# Patient Record
Sex: Male | Born: 1980 | Race: Black or African American | Hispanic: No | Marital: Single | Smoking: Current every day smoker
Health system: Southern US, Community
[De-identification: ages and names within clinical notes are randomized; demographics above are authoritative.]

## PROBLEM LIST (undated history)

## (undated) DIAGNOSIS — I639 Cerebral infarction, unspecified: Secondary | ICD-10-CM

## (undated) DIAGNOSIS — B0221 Postherpetic geniculate ganglionitis: Secondary | ICD-10-CM

## (undated) DIAGNOSIS — I10 Essential (primary) hypertension: Secondary | ICD-10-CM

## (undated) HISTORY — PX: FRACTURE SURGERY: SHX138

---

## 2018-06-16 ENCOUNTER — Emergency Department (HOSPITAL_COMMUNITY)
Admission: EM | Admit: 2018-06-16 | Discharge: 2018-06-16 | Disposition: A | Discharge status: Home / Self Care | Payer: Self-pay | Attending: Emergency Medicine | Admitting: Emergency Medicine

## 2018-06-16 ENCOUNTER — Encounter (HOSPITAL_COMMUNITY): Payer: Self-pay | Admitting: Emergency Medicine

## 2018-06-16 ENCOUNTER — Other Ambulatory Visit: Payer: Self-pay

## 2018-06-16 DIAGNOSIS — J020 Streptococcal pharyngitis: Secondary | ICD-10-CM | POA: Insufficient documentation

## 2018-06-16 DIAGNOSIS — F1210 Cannabis abuse, uncomplicated: Secondary | ICD-10-CM | POA: Insufficient documentation

## 2018-06-16 DIAGNOSIS — F17290 Nicotine dependence, other tobacco product, uncomplicated: Secondary | ICD-10-CM | POA: Insufficient documentation

## 2018-06-16 DIAGNOSIS — I10 Essential (primary) hypertension: Secondary | ICD-10-CM | POA: Insufficient documentation

## 2018-06-16 HISTORY — DX: Essential (primary) hypertension: I10

## 2018-06-16 LAB — GROUP A STREP BY PCR: Group A Strep by PCR: DETECTED — AB

## 2018-06-16 MED ORDER — DEXAMETHASONE 4 MG PO TABS
10.0000 mg | ORAL_TABLET | Freq: Once | ORAL | Status: AC
  Administered 2018-06-16: 10 mg via ORAL
  Filled 2018-06-16: qty 2

## 2018-06-16 MED ORDER — LIDOCAINE VISCOUS HCL 2 % MT SOLN: 15.0000 mL | OROMUCOSAL | 0 refills | Status: DC | PRN

## 2018-06-16 MED ORDER — IBUPROFEN 600 MG PO TABS: 600.0000 mg | ORAL_TABLET | Freq: Four times a day (QID) | ORAL | 0 refills | Status: DC | PRN

## 2018-06-16 MED ORDER — IBUPROFEN 200 MG PO TABS
600.0000 mg | ORAL_TABLET | Freq: Once | ORAL | Status: AC
Start: 2018-06-16 — End: 2018-06-16
  Administered 2018-06-16: 600 mg via ORAL
  Filled 2018-06-16: qty 3

## 2018-06-16 MED ORDER — PENICILLIN G BENZATHINE 1200000 UNIT/2ML IM SUSP
1.2000 10*6.[IU] | Freq: Once | INTRAMUSCULAR | Status: AC
  Administered 2018-06-16: 1.2 10*6.[IU] via INTRAMUSCULAR
  Filled 2018-06-16: qty 2

## 2018-06-16 NOTE — ED Provider Notes (Signed)
[facility]   CSN: [id number] Arrival date & time: 06/16/18  0501     History   Chief Complaint Chief Complaint  Patient presents with  . Sore Throat    HPI Gregory Blackwell is a 37 y.o. male with history of hypertension who presents emergency department today for sore throat over the last several days.  Patient reports that approximately 1 week ago he had oral sexual intercourse with a new male partner.  He states that since that time he has been having a scratchy, sore throat.  He is concerned he may have caught an STD and wants to be tested.  He denies any interventions prior to arrival.  Nothing makes his symptoms better or worse.  He denies history of STDs in the past.  He would not like to be tested for HIV or syphilis today and is refusing blood test.  He denies other forms of sexual activity.  He denies any penile pain, penile discharge, urinary symptoms, testicular pain/swelling, rashes/lesions, abdominal pain, painful bowel movements or urinary symptoms. Denies fever, chills, inability to control secretions, N/V, abdominal pain, cough, congestion, voice change, dental disease or trauma.   HPI  Past Medical History:  Diagnosis Date  . Hypertension     There are no active problems to display for this patient.   History reviewed. No pertinent surgical history.      Home Medications    Prior to Admission medications   Not on File    Family History No family history on file.  Social History Social History   Tobacco Use  . Smoking status: Current Every Day Smoker    Packs/day: 0.25    Types: Cigars  . Smokeless tobacco: Never Used  Substance Use Topics  . Alcohol use: Yes    Comment: occasional  . Drug use: Yes    Types: Marijuana     Allergies   Patient has no known allergies.   Review of Systems Review of Systems  All other systems reviewed and are negative.    Physical Exam Updated Vital  Signs BP (!) 186/100 (BP Location: Left Arm) Comment: pt used to take bp meds, but is between PCPs  Pulse 62   Temp 98.6 F (37 C) (Oral)   Resp 18   Ht 5\' 11"  (1.803 m)   Wt 92.5 kg (204 lb)   SpO2 100%   BMI 28.45 kg/m   Physical Exam  Constitutional: He appears well-developed and well-nourished. No distress.  HENT:  Head: Normocephalic and atraumatic.  Right Ear: Hearing, tympanic membrane, external ear and ear canal normal. No foreign bodies. Tympanic membrane is not injected, not perforated, not erythematous, not retracted and not bulging.  Left Ear: Hearing, tympanic membrane, external ear and ear canal normal. No foreign bodies. Tympanic membrane is not injected, not perforated, not erythematous, not retracted and not bulging.  Nose: Nose normal. No mucosal edema, rhinorrhea, sinus tenderness, septal deviation or nasal septal hematoma.  No foreign bodies. Right sinus exhibits no maxillary sinus tenderness and no frontal sinus tenderness. Left sinus exhibits no maxillary sinus tenderness and no frontal sinus tenderness.  Mouth/Throat: Tonsils are 2+ on the right. Tonsils are 2+ on the left.  The patient has normal phonation and is in control of secretions. No stridor.  Midline uvula without edema. Soft palate rises symmetrically.  Tonsillar erythema without exudates. No PTA. Tongue protrusion is normal. No trismus. No creptius on neck palpation and patient has good dentition. No  gingival erythema or fluctuance noted. Mucus membranes moist.  Eyes: Pupils are equal, round, and reactive to light. Conjunctivae, EOM and lids are normal. Right eye exhibits no discharge. Left eye exhibits no discharge. Right conjunctiva is not injected. Left conjunctiva is not injected.  Neck: Trachea normal, normal range of motion, full passive range of motion without pain and phonation normal. Neck supple. No spinous process tenderness and no muscular tenderness present. No neck rigidity. No tracheal deviation  and normal range of motion present.  No nuchal rigidity or meningismus  Cardiovascular: Normal rate and regular rhythm.  Pulmonary/Chest: Effort normal and breath sounds normal. No stridor. He has no wheezes.  Abdominal: Soft.  Lymphadenopathy:       Head (right side): No submental, no submandibular, no tonsillar, no preauricular, no posterior auricular and no occipital adenopathy present.       Head (left side): No submental, no submandibular, no tonsillar, no preauricular, no posterior auricular and no occipital adenopathy present.    He has no cervical adenopathy.  Neurological: He is alert.  Skin: Skin is warm and dry. No rash noted.  Psychiatric: He has a normal mood and affect.  Nursing note and vitals reviewed.    ED Treatments / Results  Labs (all labs ordered are listed, but only abnormal results are displayed) Labs Reviewed  GROUP A STREP BY PCR    EKG None  Radiology No results found.  Procedures Procedures (including critical care time)  Medications Ordered in ED Medications  penicillin g benzathine (BICILLIN LA) 1200000 UNIT/2ML injection 1.2 Million Units (1.2 Million Units Intramuscular Given 06/16/18 0813)  ibuprofen (ADVIL,MOTRIN) tablet 600 mg (600 mg Oral Given 06/16/18 0811)  dexamethasone (DECADRON) tablet 10 mg (10 mg Oral Given 06/16/18 4098)     Initial Impression / Assessment and Plan / ED Course  I have reviewed the triage vital signs and the nursing notes.  Pertinent labs & imaging results that were available during my care of the patient were reviewed by me and considered in my medical decision making (see chart for details).     37 y.o. male with sore throat. He is concerned he may have G/C as he had recent oral sex with new male partner.  Vital signs are reassuring.  No evidence of dehydration.  Is tolerating p.o. fluids.  No trismus or uvular deviation.  No concern for PTA or RPA.  Will obtain strep and gonorrhea, chlamydia cultures.   Patient denied HIV or RPR testing.  Strep test positive.  Will treat with IM penicillin, Decadron and ibuprofen.  Patient tolerating p.o. fluids in the department.  He is without signs of dehydration.  He remained hemodynamically stable.  No reaction to penicillin IM.  Will discharge home on ibuprofen.  Recommended switching toothbrush in the next 2 days. I advised the patient to follow-up with PCP this week. Specific return precautions discussed. Time was given for all questions to be answered. The patient verbalized understanding and agreement with plan. The patient appears safe for discharge home.   Final Clinical Impressions(s) / ED Diagnoses   Final diagnoses:  Strep pharyngitis    ED Discharge Orders        Ordered    ibuprofen (ADVIL,MOTRIN) 600 MG tablet  Every 6 hours PRN     06/16/18 0829    lidocaine (XYLOCAINE) 2 % solution  As needed     06/16/18 1191       Jacinto Halim, PA-C 06/16/18 0834    Drema Pry  Domingo Cocking, MD 06/16/18 [phone]

## 2018-06-16 NOTE — Discharge Instructions (Addendum)
Please read and follow all provided instructions.  Your diagnoses today include: Strep Throat  Tests performed today include: Vital signs. See below for your results today.  Strep Test: This was positive Gonorrhea and Chlymaidia cultures. - these take ~48-72 to result.   Medications prescribed:  You were given a dose of steroids and antibiotics in the department   Home care instructions:  This is a bacterial infection. Continue to stay well-hydrated. Gargle warm salt water and spit it out. Continued to alternate between Tylenol and ibuprofen for pain. May consider over-the-counter Benadryl for additional relief (decrease secretions). Also discard your toothbrush and begin using a new one in 3 days. Follow-up with your primary care doctor in this week for recheck of ongoing symptoms.   Follow-up instructions: Please follow-up with your primary care provider in 2-3 days for follow up.    Return instructions:  Return to the ED sooner for worsening condition, inability to swallow, breathing difficulty, new concerns.  Additional Information:  Your vital signs today were: BP (!) 139/51    Pulse 68    Temp 98.6 F (37 C) (Oral)    Resp 16    Ht 5\' 11"  (1.803 m)    Wt 92.5 kg (204 lb)    SpO2 100%    BMI 28.45 kg/m  If your blood pressure (BP) was elevated above 135/85 this visit, please have this repeated by your doctor within one month. ---------------

## 2018-06-16 NOTE — ED Triage Notes (Signed)
Pt states his throat feels sore and is concerned that he may have caught something from his girlfriend after performing oral sex on her recently.

## 2018-06-17 LAB — GC/CHLAMYDIA PROBE AMP (~~LOC~~) NOT AT ARMC
CHLAMYDIA, DNA PROBE: NEGATIVE
Neisseria Gonorrhea: NEGATIVE

## 2018-07-31 ENCOUNTER — Other Ambulatory Visit: Payer: Self-pay

## 2018-07-31 ENCOUNTER — Emergency Department (HOSPITAL_COMMUNITY): Payer: Self-pay

## 2018-07-31 ENCOUNTER — Encounter (HOSPITAL_COMMUNITY): Payer: Self-pay

## 2018-07-31 ENCOUNTER — Emergency Department (HOSPITAL_COMMUNITY)
Admission: EM | Admit: 2018-07-31 | Discharge: 2018-07-31 | Disposition: A | Discharge status: Home / Self Care | Payer: Self-pay | Attending: Emergency Medicine | Admitting: Emergency Medicine

## 2018-07-31 DIAGNOSIS — R202 Paresthesia of skin: Secondary | ICD-10-CM

## 2018-07-31 DIAGNOSIS — M25512 Pain in left shoulder: Secondary | ICD-10-CM

## 2018-07-31 DIAGNOSIS — Y999 Unspecified external cause status: Secondary | ICD-10-CM | POA: Insufficient documentation

## 2018-07-31 DIAGNOSIS — Y939 Activity, unspecified: Secondary | ICD-10-CM | POA: Insufficient documentation

## 2018-07-31 DIAGNOSIS — Y929 Unspecified place or not applicable: Secondary | ICD-10-CM | POA: Insufficient documentation

## 2018-07-31 DIAGNOSIS — M542 Cervicalgia: Secondary | ICD-10-CM

## 2018-07-31 DIAGNOSIS — I10 Essential (primary) hypertension: Secondary | ICD-10-CM | POA: Insufficient documentation

## 2018-07-31 DIAGNOSIS — F17210 Nicotine dependence, cigarettes, uncomplicated: Secondary | ICD-10-CM | POA: Insufficient documentation

## 2018-07-31 MED ORDER — IBUPROFEN 600 MG PO TABS: 600.0000 mg | ORAL_TABLET | Freq: Four times a day (QID) | ORAL | 0 refills | Status: DC | PRN

## 2018-07-31 MED ORDER — HYDROCODONE-ACETAMINOPHEN 5-325 MG PO TABS
1.0000 | ORAL_TABLET | Freq: Once | ORAL | Status: AC
  Administered 2018-07-31: 1 via ORAL
  Filled 2018-07-31: qty 1

## 2018-07-31 MED ORDER — LORAZEPAM 2 MG/ML IJ SOLN
1.0000 mg | Freq: Once | INTRAMUSCULAR | Status: AC | PRN
  Administered 2018-07-31: 1 mg via INTRAVENOUS
  Filled 2018-07-31: qty 1

## 2018-07-31 MED ORDER — METHOCARBAMOL 500 MG PO TABS: 500.0000 mg | ORAL_TABLET | Freq: Two times a day (BID) | ORAL | 0 refills | Status: DC
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
## 2018-07-31 NOTE — ED Notes (Signed)
Patient in radiology
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
## 2018-07-31 NOTE — Discharge Instructions (Signed)
The pain your experiencing is likely due to muscle strain, you may take Ibuprofen and Robaxin as needed for pain management. Do not combine with any pain reliever other than tylenol. The muscle soreness should improve over the next week. Follow up with your family doctor in the next week for a recheck if you are still having symptoms. Return to ED if pain is worsening, you develop weakness or numbness of extremities, or new or concerning symptoms develop. °

## 2018-07-31 NOTE — ED Notes (Signed)
Patient transported to MRI 
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
## 2023-05-25 ENCOUNTER — Other Ambulatory Visit: Payer: Self-pay

## 2023-05-25 ENCOUNTER — Emergency Department (HOSPITAL_BASED_OUTPATIENT_CLINIC_OR_DEPARTMENT_OTHER)
Admission: EM | Admit: 2023-05-25 | Discharge: 2023-05-25 | Disposition: A | Discharge status: Home / Self Care | Payer: No Typology Code available for payment source | Attending: Emergency Medicine | Admitting: Emergency Medicine

## 2023-05-25 ENCOUNTER — Encounter (HOSPITAL_BASED_OUTPATIENT_CLINIC_OR_DEPARTMENT_OTHER): Payer: Self-pay

## 2023-05-25 DIAGNOSIS — S29012A Strain of muscle and tendon of back wall of thorax, initial encounter: Secondary | ICD-10-CM | POA: Diagnosis not present

## 2023-05-25 DIAGNOSIS — M546 Pain in thoracic spine: Secondary | ICD-10-CM | POA: Diagnosis present

## 2023-05-25 DIAGNOSIS — Y92410 Unspecified street and highway as the place of occurrence of the external cause: Secondary | ICD-10-CM | POA: Diagnosis not present

## 2023-05-25 MED ORDER — CYCLOBENZAPRINE HCL 10 MG PO TABS: 10.0000 mg | ORAL_TABLET | Freq: Three times a day (TID) | ORAL | 0 refills | Status: AC | PRN

## 2023-05-25 MED ORDER — CYCLOBENZAPRINE HCL 10 MG PO TABS
10.0000 mg | ORAL_TABLET | Freq: Once | ORAL | Status: AC
  Administered 2023-05-25: 10 mg via ORAL
  Filled 2023-05-25: qty 1

## 2023-05-25 MED ORDER — KETOROLAC TROMETHAMINE 15 MG/ML IJ SOLN
15.0000 mg | Freq: Once | INTRAMUSCULAR | Status: AC
  Administered 2023-05-25: 15 mg via INTRAVENOUS
  Filled 2023-05-25: qty 1

## 2023-05-25 MED ORDER — LIDOCAINE 5 % EX PTCH: 1.0000 | MEDICATED_PATCH | CUTANEOUS | 0 refills | Status: AC

## 2023-05-25 MED ORDER — IBUPROFEN 600 MG PO TABS: 600.0000 mg | ORAL_TABLET | Freq: Three times a day (TID) | ORAL | 0 refills | Status: AC | PRN
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
## 2023-05-25 NOTE — Discharge Instructions (Signed)
Thank you for letting us take care of you today.  Your exam was reassuring. I am prescribing multiple medications to help with your symptoms. You may take over the counter Tylenol in addition to these medications. It is important to remain active to prevent further tightening up of your muscles which can prolong and worsen your pain.   Follow up with your PCP for any continued symptoms. For new or worsening symptoms, return to nearest ED for re-evaluation.
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
## 2023-06-20 ENCOUNTER — Other Ambulatory Visit: Payer: Self-pay

## 2023-06-20 ENCOUNTER — Encounter (HOSPITAL_BASED_OUTPATIENT_CLINIC_OR_DEPARTMENT_OTHER): Payer: Self-pay

## 2023-06-20 ENCOUNTER — Emergency Department (HOSPITAL_BASED_OUTPATIENT_CLINIC_OR_DEPARTMENT_OTHER)
Admission: EM | Admit: 2023-06-20 | Discharge: 2023-06-20 | Disposition: A | Discharge status: Home / Self Care | Payer: Self-pay | Attending: Emergency Medicine | Admitting: Emergency Medicine

## 2023-06-20 DIAGNOSIS — Z202 Contact with and (suspected) exposure to infections with a predominantly sexual mode of transmission: Secondary | ICD-10-CM | POA: Insufficient documentation

## 2023-06-20 LAB — RPR: RPR Ser Ql: NONREACTIVE

## 2023-06-20 LAB — RAPID HIV SCREEN (HIV 1/2 AB+AG)
HIV 1/2 Antibodies: NONREACTIVE
HIV-1 P24 Antigen - HIV24: NONREACTIVE

## 2023-06-20 NOTE — ED Provider Notes (Signed)
[place] EMERGENCY DEPARTMENT AT [facility] Provider Note   CSN: [id number] Arrival date & time: 06/20/23  8295     History  Chief Complaint  Patient presents with   Follow-up    Gregory Blackwell is a 42 y.o. male.  The history is provided by the patient and medical records. No language interpreter was used.  Exposure to STD This is a chronic problem. The problem has not changed since onset.Pertinent negatives include no chest pain, no abdominal pain, no headaches and no shortness of breath. Nothing aggravates the symptoms. Nothing relieves the symptoms. He has tried nothing for the symptoms. The treatment provided no relief.       Home Medications Prior to Admission medications   Not on File      Allergies    Patient has no known allergies.    Review of Systems   Review of Systems  Constitutional:  Negative for chills, fatigue and fever.  HENT:  Negative for congestion.   Respiratory:  Negative for cough, chest tightness, shortness of breath and wheezing.   Cardiovascular:  Negative for chest pain and palpitations.  Gastrointestinal:  Negative for abdominal pain, constipation, diarrhea, nausea and vomiting.  Genitourinary:  Negative for dysuria and flank pain.  Musculoskeletal:  Negative for back pain and neck pain.  Skin:  Negative for color change, rash and wound.  Neurological:  Negative for light-headedness and headaches.  Psychiatric/Behavioral:  Negative for agitation and confusion.   All other systems reviewed and are negative.   Physical Exam Updated Vital Signs BP (!) 163/99 (BP Location: Left Arm)   Pulse 62   Temp 98 F (36.7 C) (Oral)   Resp 17   Ht 5\' 11"  (1.803 m)   Wt 95.3 kg   SpO2 100%   BMI 29.29 kg/m  Physical Exam Vitals and nursing note reviewed.  Constitutional:      General: He is not in acute distress.    Appearance: He is well-developed. He is not ill-appearing or diaphoretic.  HENT:     Head: Normocephalic and  atraumatic.     Mouth/Throat:     Mouth: Mucous membranes are moist.  Eyes:     Extraocular Movements: Extraocular movements intact.     Conjunctiva/sclera: Conjunctivae normal.     Pupils: Pupils are equal, round, and reactive to light.  Cardiovascular:     Rate and Rhythm: Normal rate and regular rhythm.     Pulses: Normal pulses.     Heart sounds: No murmur heard. Pulmonary:     Effort: Pulmonary effort is normal. No respiratory distress.     Breath sounds: Normal breath sounds. No wheezing, rhonchi or rales.  Chest:     Chest wall: No tenderness.  Abdominal:     Palpations: Abdomen is soft.     Tenderness: There is no abdominal tenderness. There is no right CVA tenderness, left CVA tenderness, guarding or rebound.  Musculoskeletal:        General: No swelling or tenderness.     Cervical back: Neck supple. No tenderness.     Right lower leg: No edema.     Left lower leg: No edema.  Skin:    General: Skin is warm and dry.     Capillary Refill: Capillary refill takes less than 2 seconds.     Findings: No erythema or rash.  Neurological:     General: No focal deficit present.     Mental Status: He is alert.     Sensory:  No sensory deficit.     Motor: No weakness.  Psychiatric:        Mood and Affect: Mood normal.     ED Results / Procedures / Treatments   Labs (all labs ordered are listed, but only abnormal results are displayed) Labs Reviewed  RPR  RAPID HIV SCREEN (HIV 1/2 AB+AG)  GC/CHLAMYDIA PROBE AMP ([place]) NOT AT [facility]    EKG None  Radiology No results found.  Procedures Procedures    Medications Ordered in ED Medications - No data to display  ED Course/ Medical Decision Making/ A&P                             Medical Decision Making Amount and/or Complexity of Data Reviewed Labs: ordered.    [name] is a 42 y.o. male with a possible past medical history significant for hypertension who presents for STD check and "everything  checked out".  Patient reports that he has not seen a primary doctor in several years and just got his medical card and wants a checkup.  He reports that he thinks his cholesterol is high in the past and he wanted to get PCP evaluation.  He said he does not have a PCP yet.  He also wants to be checked for possible sexual transmitted disease but denies any known exposure or symptoms.  He denies any physical symptoms whatsoever with no fevers, chills, congestion, cough, nausea, vomiting, constipation, diarrhea, or urinary changes.  No rashes.  No pain.  Patient reports that he is slightly stressed and thinks that is increasing his blood pressure but otherwise denies complaints.  On exam, lungs clear.  Chest nontender.  Abdomen nontender.  No murmur.  No focal neurologic deficits.  Patient resting comfortably.  We had a shared decision-making conversation and discussed evaluations in the emergency department versus at a PCP.  Patient is agreeable to follow-up with a PCP to get the lab work checked by PCP that they then will be able to potentially intervene on.  Given his lack of any physical complaints today, we agreed to hold on extensive lab workup here although with this possibility of STI exposure, we did feel that was more of an emergency evaluation and he will have a GC/committee test sent, HIV test, and RPR.  Patient will follow-up on these results with the PCP and he likely would be called if something was positive.  Patient agrees with this plan.  Blood pressure appears to be in the 140s in the past and is 152 systolic now.  We discussed that if he continues to have elevated blood pressures, he may be amenable to starting blood pressure medicine but would defer to the PCP given his lack of any complaints at this time and it only being slightly elevated in the setting of stress of the emergency department.  Patient agrees with this plan.  We also agreed to hold on empiric treatment for possible STI  given his lack of any STD related symptoms at this time.  Patient will be discharged for outpatient follow-up and understood return precautions.  He no other questions or concerns and was discharged in good condition.         Final Clinical Impression(s) / ED Diagnoses Final diagnoses:  Possible exposure to STD    Rx / DC Orders ED Discharge Orders     None      Clinical Impression: 1. Possible exposure to STD  Disposition: Discharge  Condition: Good  I have discussed the results, Dx and Tx plan with the pt(& family if present). He/she/they expressed understanding and agree(s) with the plan. Discharge instructions discussed at great length. Strict return precautions discussed and pt &/or family have verbalized understanding of the instructions. No further questions at time of discharge.    New Prescriptions   No medications on file    Follow Up: [facility] [address] [place] [address] [place] [address] [phone] Schedule an appointment as soon as possible for a visit  please call to schedule an appointment with a PCP     Dillion Stowers, Canary Brim, MD 06/20/23 [phone]

## 2023-06-20 NOTE — Discharge Instructions (Signed)
Your history and exam today were overall reassuring given your lack of any physical complaints at this time.  We did agree with STD testing that was sent off and will not return today.  Anticipate you will be called if something is positive but with lack of symptoms this is felt to be less likely.  Please follow-up with a primary care physician for outpatient management.  We agreed with a shared decision-making conversation to hold on extensive lab testing today and will defer chronic management to a primary care physician.  If any symptoms change or worsen acutely, please return to the nearest emergency department.

## 2023-06-20 NOTE — ED Triage Notes (Signed)
Patient arrives to ED POV requesting for general check up. Pt stated "I just got my medical card and want to check my cholesterol, blood work, blood pressure and get checked for STD". Pt is also seeking primary care doctor. No complaints at this time. Pt A/O x4.

## 2023-06-21 LAB — GC/CHLAMYDIA PROBE AMP (~~LOC~~) NOT AT ARMC
Chlamydia: NEGATIVE
Comment: NEGATIVE
Comment: NORMAL
Neisseria Gonorrhea: NEGATIVE

## 2023-11-28 ENCOUNTER — Encounter (HOSPITAL_BASED_OUTPATIENT_CLINIC_OR_DEPARTMENT_OTHER): Payer: Self-pay

## 2023-11-28 ENCOUNTER — Other Ambulatory Visit: Payer: Self-pay

## 2023-11-28 ENCOUNTER — Emergency Department (HOSPITAL_BASED_OUTPATIENT_CLINIC_OR_DEPARTMENT_OTHER)
Admission: EM | Admit: 2023-11-28 | Discharge: 2023-11-28 | Disposition: A | Discharge status: Home / Self Care | Payer: BC Managed Care – PPO | Attending: Emergency Medicine | Admitting: Emergency Medicine

## 2023-11-28 ENCOUNTER — Emergency Department (HOSPITAL_BASED_OUTPATIENT_CLINIC_OR_DEPARTMENT_OTHER): Payer: BC Managed Care – PPO

## 2023-11-28 DIAGNOSIS — F411 Generalized anxiety disorder: Secondary | ICD-10-CM | POA: Insufficient documentation

## 2023-11-28 DIAGNOSIS — R079 Chest pain, unspecified: Secondary | ICD-10-CM | POA: Insufficient documentation

## 2023-11-28 DIAGNOSIS — F419 Anxiety disorder, unspecified: Secondary | ICD-10-CM | POA: Diagnosis present

## 2023-11-28 HISTORY — DX: Cerebral infarction, unspecified: I63.9

## 2023-11-28 HISTORY — DX: Postherpetic geniculate ganglionitis: B02.21

## 2023-11-28 LAB — CBC
HCT: 40.6 % (ref 39.0–52.0)
Hemoglobin: 14.2 g/dL (ref 13.0–17.0)
MCH: 31.8 pg (ref 26.0–34.0)
MCHC: 35 g/dL (ref 30.0–36.0)
MCV: 90.8 fL (ref 80.0–100.0)
Platelets: 317 10*3/uL (ref 150–400)
RBC: 4.47 MIL/uL (ref 4.22–5.81)
RDW: 14.6 % (ref 11.5–15.5)
WBC: 7.9 10*3/uL (ref 4.0–10.5)
nRBC: 0 % (ref 0.0–0.2)

## 2023-11-28 LAB — BASIC METABOLIC PANEL
Anion gap: 10 (ref 5–15)
BUN: 19 mg/dL (ref 6–20)
CO2: 29 mmol/L (ref 22–32)
Calcium: 9.8 mg/dL (ref 8.9–10.3)
Chloride: 101 mmol/L (ref 98–111)
Creatinine, Ser: 1.15 mg/dL (ref 0.61–1.24)
GFR, Estimated: >60 mL/min (ref >60)
Glucose, Bld: 109 mg/dL — ABNORMAL HIGH (ref 70–99)
Potassium: 3.8 mmol/L (ref 3.5–5.1)
Sodium: 140 mmol/L (ref 135–145)

## 2023-11-28 LAB — TROPONIN I (HIGH SENSITIVITY): Troponin I (High Sensitivity): 6 ng/L (ref <18)

## 2023-11-28 MED ORDER — SERTRALINE HCL 50 MG PO TABS: 50.0000 mg | ORAL_TABLET | Freq: Every day | ORAL | 0 refills | Status: AC

## 2023-11-28 NOTE — ED Notes (Signed)
Discharge instructions, follow up care, and prescription reviewed and explained, pt verbalized understanding and had no further questions on d/c. Pt caox4, ambulatory, NAD on d/c.  

## 2023-11-28 NOTE — ED Notes (Signed)
RBVO cancel 2nd troponin per Dr. Andria Meuse.

## 2023-11-28 NOTE — ED Triage Notes (Signed)
In for eval of left chest pain characterized as pressure, throbbing, and tightness onset 2 weeks. He presents with severe anxiety, history of depression, and he is tearful. 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 His is under financial, family, health related, and work stress that impacting his life.   PMH of Ramsey Hunt syndrome x2, HTN, stroke.

## 2023-11-28 NOTE — Discharge Instructions (Addendum)
While you were in the emergency department, you had testing done to look for injury to your heart.  This testing was all normal.  I have sent your prescription for medication called sertraline.  This is an antidepressant.  Lots of people take these medications.  Including your discharge paperwork is a telephone number for the hotline to get established with a primary care doctor.  I encourage you to call them today to set up care.  It may be up to 1 month before you are able to get an appointment.  Also including your discharge paperwork is a list of outpatient therapy providers.  I apologize that this is list also includes sites that do substance abuse counseling.  I would encourage you to contact some of the numbers on the list to get set up with an appointment.  Please return to the emergency department if you have any thoughts of harming yourself, harming others, or just need someone to talk to.  The emergency department is always open.

## 2023-11-28 NOTE — ED Provider Notes (Signed)
[place] EMERGENCY DEPARTMENT AT [facility] Provider Note   CSN: [id number] Arrival date & time: 11/28/23  1025     History  Chief Complaint  Patient presents with   Chest Pain   Anxiety    Mckoy Targett is a 42 y.o. male.  This is a 42 year old male is here today for chest pain and anxiety.  Patient says that over the last several months he has been feeling overwhelmed, has problems of worrying about things which have not occurred.  He says that he has been stressing about his financial situation, his job, and is currently having issues with custody with his child between him and his ex-wife.  He says that over the last few days he has noticed that he has increasing pain in his chest.  No shortness of breath.   Chest Pain Associated symptoms: anxiety   Anxiety Associated symptoms include chest pain.       Home Medications Prior to Admission medications   Medication Sig Start Date End Date Taking? Authorizing Provider  sertraline (ZOLOFT) 50 MG tablet Take 1 tablet (50 mg total) by mouth daily. 11/28/23 12/28/23 Yes Anders Simmonds T, DO      Allergies    Patient has no known allergies.    Review of Systems   Review of Systems  Cardiovascular:  Positive for chest pain.    Physical Exam Updated Vital Signs BP (!) 153/83   Pulse (!) 57   Temp 98.2 F (36.8 C) (Oral)   Resp 18   Ht 5\' 11"  (1.803 m)   Wt 94.3 kg   SpO2 98%   BMI 29.01 kg/m  Physical Exam Vitals reviewed.  Cardiovascular:     Rate and Rhythm: Normal rate.     Heart sounds: Normal heart sounds.  Pulmonary:     Effort: Pulmonary effort is normal.     Breath sounds: Normal breath sounds.  Abdominal:     Palpations: Abdomen is soft.  Musculoskeletal:        General: Normal range of motion.     Cervical back: Normal range of motion.  Neurological:     General: No focal deficit present.     Mental Status: He is alert.  Psychiatric:     Comments: Patient reports mood is depressed.   He denies any SI or HI.  They are forward focus.  Patient is very focused on being a good father to his son.  He adamantly denies any SI or HI.  Does not appear to be responding to any internal stimuli.     ED Results / Procedures / Treatments   Labs (all labs ordered are listed, but only abnormal results are displayed) Labs Reviewed  BASIC METABOLIC PANEL - Abnormal; Notable for the following components:      Result Value   Glucose, Bld 109 (*)    All other components within normal limits  CBC  TROPONIN I (HIGH SENSITIVITY)  TROPONIN I (HIGH SENSITIVITY)    EKG None  Radiology DG Chest Port 1 View  Result Date: 11/28/2023 CLINICAL DATA:  Left chest pain and pressure EXAM: PORTABLE CHEST 1 VIEW COMPARISON:  None Available. FINDINGS: The heart size and mediastinal contours are within normal limits. Both lungs are clear. The visualized skeletal structures are unremarkable. IMPRESSION: No active disease. Electronically Signed   By: Judie Petit.  Shick M.D.   On: 11/28/2023 11:50    Procedures Procedures    Medications Ordered in ED Medications - No data to display  ED Course/ Medical Decision Making/ A&P                                 Medical Decision Making This is a 42 year old male who is here today for chest pain and anxiety.  Differential diagnoses include anxiety, depression, less likely ACS.  Plan-for speaking with the patient, is very clear that he is feeling a bit overwhelmed with life.  He states that he is been feeling this way for several months.  He says he is not somewhat usually talks to people about feelings, emotions.  He reports that some of his life stressors have been getting him more more down.  He says that has been interfering with his day-to-day functioning.  Screening positive for depression, anxiety.  Patient low risk for suicide based on my exam.  Regarding the patient's chest pain, I think that this is anxiety related.  My independent review the patient's  EKG shows no ST segment depressions, no T wave inversions, no evidence of acute ischemia.  My independent review the patient's chest x-ray shows no pneumonia or pneumothorax.  Patient does not have a PCP.  Blood pressure is mildly elevated.  Patient not interested in starting blood pressure medications at this time.  I do not often treat people who presents emergency department with anxiety and depression with SSRIs due to limited follow-up, however patient does appear very motivated to make changes in his life, and wants to establish care with a primary care doctor.  I think that it was a big step for him to come to the emergency department when he is not someone who regularly discusses things like this, so I believe starting the patient on an SSRI is a net benefit and will help establish further trust with healthcare system and lead to better compliance.  Will provide patient with referral to behavioral health outpatient.  Will instruct him to contact hotline for PCP referral.   Amount and/or Complexity of Data Reviewed Labs: ordered. Radiology: ordered.  Risk Prescription drug management.           Final Clinical Impression(s) / ED Diagnoses Final diagnoses:  Generalized anxiety disorder  Chest pain, unspecified type    Rx / DC Orders ED Discharge Orders          Ordered    sertraline (ZOLOFT) 50 MG tablet  Daily        11/28/23 1243              Anders Simmonds T, DO 11/28/23 1307

## 2024-03-10 ENCOUNTER — Other Ambulatory Visit: Payer: Self-pay

## 2024-03-10 ENCOUNTER — Encounter (HOSPITAL_BASED_OUTPATIENT_CLINIC_OR_DEPARTMENT_OTHER): Payer: Self-pay

## 2024-03-10 ENCOUNTER — Emergency Department (HOSPITAL_BASED_OUTPATIENT_CLINIC_OR_DEPARTMENT_OTHER)
Admission: EM | Admit: 2024-03-10 | Discharge: 2024-03-10 | Disposition: A | Discharge status: Home / Self Care | Payer: Self-pay | Attending: Emergency Medicine | Admitting: Emergency Medicine

## 2024-03-10 DIAGNOSIS — A64 Unspecified sexually transmitted disease: Secondary | ICD-10-CM | POA: Insufficient documentation

## 2024-03-10 DIAGNOSIS — I10 Essential (primary) hypertension: Secondary | ICD-10-CM | POA: Insufficient documentation

## 2024-03-10 DIAGNOSIS — Z113 Encounter for screening for infections with a predominantly sexual mode of transmission: Secondary | ICD-10-CM

## 2024-03-10 MED ORDER — AMLODIPINE BESYLATE 5 MG PO TABS: 5.0000 mg | ORAL_TABLET | Freq: Every day | ORAL | 0 refills | Status: AC

## 2024-03-10 NOTE — ED Notes (Signed)
 Spoke with lab to process gc chlamydia swab

## 2024-03-10 NOTE — ED Triage Notes (Signed)
 Pt states he wants to check & make sure he doesn't have an STD. States he "doesn't have any major symptoms." Denies known exposure

## 2024-03-10 NOTE — Discharge Instructions (Addendum)
 It was our pleasure to provide your ER care today - we hope that you feel better.  Your blood pressure is high - follow heart healthy eating plan, take blood pressure med as prescribed, and follow up with primary care doctor in the next couple weeks.   For urethral/penile swab - you may check MyChart for results later today or tomorrow AM (or can call tomorrow AM for results).   Return to ER if worse, new symptoms, fevers, new/severe pain, chest pain, trouble breathing, or other concern.

## 2024-03-10 NOTE — ED Notes (Signed)
 Reviewed AVS/discharge instruction with patient. Time allotted for and all questions answered. Patient is agreeable for d/c and escorted to ed exit by staff.

## 2024-03-10 NOTE — ED Provider Notes (Signed)
  EMERGENCY DEPARTMENT AT [facility] Provider Note   CSN: [id number] Arrival date & time: 03/10/24  1127     History  Chief Complaint  Patient presents with   Exposure to STD    Delvonte Berenson is a 43 y.o. male.  Pt with hx htn, here with elevated blood pressure and indicates wants check for std. No specific known exposure. Indicates is starting new relationship.  No hx hiv, gc, chlamydia. Denies dysuria. No penile discharge. No skin ulcers or rash. Does not feel sick or ill. No nv. No abd pain. No chest pain or sob. No headache.  Has been prescribed bp med in past, unsure of name, did not take. No current pcp.   The history is provided by the patient and medical records.  Exposure to STD Pertinent negatives include no chest pain, no abdominal pain, no headaches and no shortness of breath.       Home Medications Prior to Admission medications   Medication Sig Start Date End Date Taking? Authorizing Provider  sertraline (ZOLOFT) 50 MG tablet Take 1 tablet (50 mg total) by mouth daily. 11/28/23 12/28/23  Anders Simmonds T, DO      Allergies    Patient has no known allergies.    Review of Systems   Review of Systems  Constitutional:  Negative for fever.  HENT:  Negative for sore throat.   Respiratory:  Negative for shortness of breath.   Cardiovascular:  Negative for chest pain and leg swelling.  Gastrointestinal:  Negative for abdominal pain.  Genitourinary:  Negative for dysuria, penile discharge and testicular pain.  Skin:  Negative for rash.  Neurological:  Negative for weakness, numbness and headaches.    Physical Exam Updated Vital Signs BP (!) 160/94 (BP Location: Right Arm)   Pulse 72   Temp 98.6 F (37 C)   Resp 16   SpO2 100%  Physical Exam Vitals and nursing note reviewed.  Constitutional:      Appearance: Normal appearance. He is well-developed.  HENT:     Head: Atraumatic.     Mouth/Throat:     Mouth: Mucous membranes are moist.   Eyes:     General: No scleral icterus.    Conjunctiva/sclera: Conjunctivae normal.  Neck:     Trachea: No tracheal deviation.  Cardiovascular:     Rate and Rhythm: Normal rate and regular rhythm.     Pulses: Normal pulses.     Heart sounds: Normal heart sounds. No murmur heard.    No friction rub. No gallop.  Pulmonary:     Effort: Pulmonary effort is normal. No accessory muscle usage or respiratory distress.     Breath sounds: Normal breath sounds.  Abdominal:     General: There is no distension.     Palpations: Abdomen is soft.     Tenderness: There is no abdominal tenderness.  Genitourinary:    Comments: Normal external gu exam. No penile discharge noted.  Musculoskeletal:        General: No swelling.     Cervical back: Neck supple.     Right lower leg: No edema.     Left lower leg: No edema.  Skin:    General: Skin is warm and dry.     Findings: No rash.  Neurological:     Mental Status: He is alert.     Comments: Alert, speech clear. Motor/sens grossly intact bil. Steady gait.   Psychiatric:        Mood and Affect:  Mood normal.     ED Results / Procedures / Treatments   Labs (all labs ordered are listed, but only abnormal results are displayed) Labs Reviewed  GC/CHLAMYDIA PROBE AMP ([place]) NOT AT [facility]    EKG None  Radiology No results found.  Procedures Procedures    Medications Ordered in ED Medications - No data to display  ED Course/ Medical Decision Making/ A&P                                 Medical Decision Making Problems Addressed: Essential hypertension: chronic illness or injury with exacerbation, progression, or side effects of treatment that poses a threat to life or bodily functions Screening for STD (sexually transmitted disease): acute illness or injury Uncontrolled hypertension: acute illness or injury  Amount and/or Complexity of Data Reviewed External Data Reviewed: labs and notes. Labs: ordered. Decision-making details  documented in ED Course.  Risk Prescription drug management.   Labs sent.   Labs reviewed/interpreted by me - gc/chlam neg.  Reviewed nursing notes and prior charts for additional history.   Pt notes hx htn. Verified nkda and pharmacy - rx sent.   Rec pcp f/u.          Final Clinical Impression(s) / ED Diagnoses Final diagnoses:  None    Rx / DC Orders ED Discharge Orders     None         Cathren Laine, MD 03/10/24 1244

## 2024-03-11 ENCOUNTER — Telehealth (HOSPITAL_COMMUNITY): Payer: Self-pay

## 2024-03-11 LAB — GC/CHLAMYDIA PROBE AMP (~~LOC~~) NOT AT ARMC
Chlamydia: NEGATIVE
Comment: NEGATIVE
Comment: NORMAL
Neisseria Gonorrhea: NEGATIVE
